# Patient Record
Sex: Male | Born: 1965 | Race: Black or African American | Hispanic: No | Marital: Married | Smoking: Never smoker
Health system: Southern US, Community
[De-identification: ages and names within clinical notes are randomized; demographics above are authoritative.]

## PROBLEM LIST (undated history)

## (undated) DIAGNOSIS — J309 Allergic rhinitis, unspecified: Secondary | ICD-10-CM

## (undated) HISTORY — PX: NO PAST SURGERIES: SHX2092

## (undated) HISTORY — DX: Allergic rhinitis, unspecified: J30.9

---

## 2012-11-28 ENCOUNTER — Emergency Department (HOSPITAL_BASED_OUTPATIENT_CLINIC_OR_DEPARTMENT_OTHER): Payer: 59

## 2012-11-28 ENCOUNTER — Encounter (HOSPITAL_BASED_OUTPATIENT_CLINIC_OR_DEPARTMENT_OTHER): Payer: Self-pay | Admitting: *Deleted

## 2012-11-28 ENCOUNTER — Emergency Department (HOSPITAL_BASED_OUTPATIENT_CLINIC_OR_DEPARTMENT_OTHER)
Admission: EM | Admit: 2012-11-28 | Discharge: 2012-11-28 | Disposition: A | Discharge status: Home / Self Care | Payer: 59 | Attending: Emergency Medicine | Admitting: Emergency Medicine

## 2012-11-28 DIAGNOSIS — IMO0002 Reserved for concepts with insufficient information to code with codable children: Secondary | ICD-10-CM | POA: Insufficient documentation

## 2012-11-28 DIAGNOSIS — Y990 Civilian activity done for income or pay: Secondary | ICD-10-CM | POA: Insufficient documentation

## 2012-11-28 DIAGNOSIS — Y9389 Activity, other specified: Secondary | ICD-10-CM | POA: Insufficient documentation

## 2012-11-28 DIAGNOSIS — X500XXA Overexertion from strenuous movement or load, initial encounter: Secondary | ICD-10-CM | POA: Insufficient documentation

## 2012-11-28 DIAGNOSIS — Y9289 Other specified places as the place of occurrence of the external cause: Secondary | ICD-10-CM | POA: Insufficient documentation

## 2012-11-28 MED ORDER — OXYCODONE-ACETAMINOPHEN 5-325 MG PO TABS: 1.0000 | ORAL_TABLET | ORAL | Status: DC | PRN

## 2012-11-28 MED ORDER — KETOROLAC TROMETHAMINE 60 MG/2ML IM SOLN
60.0000 mg | Freq: Once | INTRAMUSCULAR | Status: AC
  Administered 2012-11-28: 60 mg via INTRAMUSCULAR
  Filled 2012-11-28: qty 2

## 2012-11-28 MED ORDER — DIAZEPAM 5 MG PO TABS
5.0000 mg | ORAL_TABLET | Freq: Once | ORAL | Status: AC
  Administered 2012-11-28: 5 mg via ORAL
  Filled 2012-11-28: qty 1
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
## 2012-11-28 NOTE — ED Notes (Signed)
Lower back pain into his right leg x 4 days. May have hurt himself pulling something at work.

## 2012-11-28 NOTE — ED Provider Notes (Signed)
Medical screening examination/treatment/procedure(s) were performed by non-physician practitioner and as supervising physician I was immediately available for consultation/collaboration.   Gwyneth Sprout, MD 11/28/12 [phone]

## 2013-01-02 ENCOUNTER — Encounter: Payer: Self-pay | Admitting: Internal Medicine

## 2013-01-02 ENCOUNTER — Ambulatory Visit (INDEPENDENT_AMBULATORY_CARE_PROVIDER_SITE_OTHER): Payer: 59 | Admitting: Internal Medicine

## 2013-01-02 VITALS — BP 122/84 | HR 57 | Temp 97.9°F | Ht 76.0 in | Wt 239.0 lb

## 2013-01-02 DIAGNOSIS — R399 Unspecified symptoms and signs involving the genitourinary system: Secondary | ICD-10-CM | POA: Insufficient documentation

## 2013-01-02 DIAGNOSIS — Z0000 Encounter for general adult medical examination without abnormal findings: Secondary | ICD-10-CM | POA: Insufficient documentation

## 2013-01-02 DIAGNOSIS — K649 Unspecified hemorrhoids: Secondary | ICD-10-CM | POA: Insufficient documentation

## 2013-01-02 DIAGNOSIS — R3989 Other symptoms and signs involving the genitourinary system: Secondary | ICD-10-CM

## 2013-01-02 LAB — POCT URINALYSIS DIPSTICK
Bilirubin, UA: NEGATIVE
Nitrite, UA: NEGATIVE
pH, UA: 6.5

## 2013-01-02 NOTE — Assessment & Plan Note (Signed)
Has ano-rectal sx on-off, related to hemorrhoids? Never had a cscope, will ask GI to eval.

## 2013-01-02 NOTE — Progress Notes (Signed)
  Subjective:    Patient ID: Casey Gault., male    DOB: 1966/02/06, 47 y.o.   MRN: [id number]  HPI New patient, requests a CPX, has other concerns, see ROS.  Past Medical History  Diagnosis Date  . Allergic rhinitis    Past Surgical History  Procedure Laterality Date  . No past surgeries     History   Social History  . Marital Status: Married    Spouse Name: N/A    Number of Children: 0  . Years of Education: N/A   Occupational History  . DRIVER, [employer]    Social History Main Topics  . Smoking status: Never Smoker   . Smokeless tobacco: Never Used  . Alcohol Use: No  . Drug Use: No  . Sexually Active: Not on file   Other Topics Concern  . Not on file   Social History Narrative   1 year of college   Moved from [place] 2012.   Lives w/ wife   Works night shift   Family History  Problem Relation Age of Onset  . Hypertension Other     F, M, siblings  . CAD Neg Hx   . Diabetes Neg Hx   . Colon cancer Neg Hx   . Prostate cancer Neg Hx       Review of Systems Experiencing urinary frequency at least 4 times during bed time and 4 times during his work shift. Denies drinking sodas but he drinks plenty of coffee. No difficulty urinating, gross hematuria or dysuria. Long history of on and off rectal discomfort, burning, occasionally sees mucus discharge. Reports no previous bleeding. Never had a colonoscopy or further evaluation. Denies fever chills. No  nausea, vomiting, diarrhea. No chest pain, shortness of breath or cough. No anxiety or depression.     Objective:   Physical Exam BP 122/84  Pulse 57  Temp(Src) 97.9 F (36.6 C) (Oral)  Ht 6\' 4"  (1.93 m)  Wt 239 lb (108.41 kg)  BMI 29.1 kg/m2  SpO2 98%  General -- alert, well-developed, No apparent distress.   Neck --no thyromegaly Lungs -- normal respiratory effort, no intercostal retractions, no accessory muscle use, and normal breath sounds.   Heart-- normal rate, regular rhythm, no murmur,  and no gallop.   Abdomen--soft, non-tender, no distention, no masses, no HSM, no guarding, and no rigidity.   Extremities-- no pretibial edema bilaterally Rectal-- No external abnormalities noted. Normal sphincter tone. No rectal masses or tenderness. Brown stool, Hemoccult negative Prostate:  Prostate gland firm and smooth, no enlargement, nodularity, tenderness, mass, asymmetry or induration. Anoscopy-- one or 2 small internal hemorrhoids without irritation or evidence of recent bleeding. Neurologic-- alert & oriented X3 and strength normal in all extremities. Psych-- Cognition and judgment appear intact. Alert and cooperative with normal attention span and concentration.  not anxious appearing and not depressed appearing.      Assessment & Plan:

## 2013-01-02 NOTE — Assessment & Plan Note (Addendum)
urinary frequency, see HPI. Sx going on x 5 years DRE normal. ?OAB ?BPH Plan: Udip-UCX Avoid caffeine PSA  Consider flomax trial

## 2013-01-02 NOTE — Patient Instructions (Addendum)
Please sign a release of information at the front desk to get  records from your  previous doctor Please come back in 3 months if your urinary symptoms continue. Next visit in one year

## 2013-01-02 NOTE — Assessment & Plan Note (Addendum)
Td-- 2013 ? Pt not sure was done a [place] when he applied for a job, will call HR Never had a cscope, see hemorrhoids Labs Diet-exercise discussed

## 2013-01-03 ENCOUNTER — Encounter: Payer: Self-pay | Admitting: Gastroenterology

## 2013-01-03 LAB — COMPREHENSIVE METABOLIC PANEL
Alkaline Phosphatase: 89 U/L (ref 39–117)
CO2: 27 mEq/L (ref 19–32)
Creatinine, Ser: 0.9 mg/dL (ref 0.4–1.5)
GFR: 114.02 mL/min (ref >60.00)
Glucose, Bld: 79 mg/dL (ref 70–99)
Total Bilirubin: 0.8 mg/dL (ref 0.3–1.2)

## 2013-01-03 LAB — CBC WITH DIFFERENTIAL/PLATELET
Basophils Relative: 0.3 % (ref 0.0–3.0)
Eosinophils Relative: 1.2 % (ref 0.0–5.0)
HCT: 45.1 % (ref 39.0–52.0)
Hemoglobin: 14.9 g/dL (ref 13.0–17.0)
Lymphs Abs: 1.6 10*3/uL (ref 0.7–4.0)
Monocytes Relative: 8.2 % (ref 3.0–12.0)
Neutro Abs: 4.3 10*3/uL (ref 1.4–7.7)
WBC: 6.6 10*3/uL (ref 4.5–10.5)

## 2013-01-03 LAB — LIPID PANEL
LDL Cholesterol: 96 mg/dL (ref 0–99)
Total CHOL/HDL Ratio: 4
VLDL: 16.4 mg/dL (ref 0.0–40.0)

## 2013-01-03 LAB — PSA: PSA: 0.8 ng/mL (ref 0.10–4.00)

## 2013-01-03 LAB — TSH: TSH: 0.79 u[IU]/mL (ref 0.35–5.50)

## 2013-01-06 ENCOUNTER — Ambulatory Visit: Payer: 59 | Admitting: Gastroenterology

## 2013-01-06 ENCOUNTER — Encounter: Payer: Self-pay | Admitting: *Deleted

## 2013-01-30 ENCOUNTER — Ambulatory Visit: Payer: 59 | Admitting: Gastroenterology

## 2013-10-02 ENCOUNTER — Encounter: Payer: Self-pay | Admitting: Emergency Medicine

## 2013-10-02 ENCOUNTER — Ambulatory Visit (INDEPENDENT_AMBULATORY_CARE_PROVIDER_SITE_OTHER): Payer: 59 | Admitting: Sports Medicine

## 2013-10-02 ENCOUNTER — Emergency Department
Admission: EM | Admit: 2013-10-02 | Discharge: 2013-10-02 | Disposition: A | Discharge status: Home / Self Care | Payer: 59 | Source: Home / Self Care

## 2013-10-02 DIAGNOSIS — M6530 Trigger finger, unspecified finger: Secondary | ICD-10-CM

## 2013-10-02 DIAGNOSIS — M65839 Other synovitis and tenosynovitis, unspecified forearm: Secondary | ICD-10-CM

## 2013-10-02 DIAGNOSIS — M65842 Other synovitis and tenosynovitis, left hand: Secondary | ICD-10-CM

## 2013-10-02 DIAGNOSIS — M65849 Other synovitis and tenosynovitis, unspecified hand: Secondary | ICD-10-CM

## 2013-10-02 DIAGNOSIS — IMO0002 Reserved for concepts with insufficient information to code with codable children: Secondary | ICD-10-CM | POA: Insufficient documentation

## 2013-10-02 DIAGNOSIS — M65841 Other synovitis and tenosynovitis, right hand: Secondary | ICD-10-CM

## 2013-10-02 NOTE — ED Provider Notes (Signed)
CSN: [id number]     Arrival date & time 10/02/13  1624 History   None    Chief Complaint  Patient presents with  . Hand Pain      HPI Comments: Patient complains of gradually developing pain in his right third finger about 3 years ago, eventually leading to frequent painful locking of his PIP joint.  Recently he has had persistent swelling and soreness of the PIP joint.  Over the past year or two his left third finger has developed similar symptoms although not as painful.  He recalls no injury to his hands.  Patient is a 48 y.o. male presenting with hand pain. The history is provided by the patient.  Hand Pain This is a chronic problem. Episode onset: 3 years ago. The problem occurs constantly. The problem has been gradually worsening. Associated symptoms comments: none. Exacerbated by: flexing his third fingers. Nothing relieves the symptoms. He has tried nothing for the symptoms.    Past Medical History  Diagnosis Date  . Allergic rhinitis    Past Surgical History  Procedure Laterality Date  . No past surgeries     Family History  Problem Relation Age of Onset  . CAD Neg Hx   . Diabetes Neg Hx   . Colon cancer Neg Hx   . Prostate cancer Neg Hx   . Hypertension Mother   . Hypertension Father   . Stroke Father   . Hypertension Sister   . Hypertension Brother    History  Substance Use Topics  . Smoking status: Never Smoker   . Smokeless tobacco: Never Used  . Alcohol Use: No    Review of Systems  All other systems reviewed and are negative.    Allergies  Sulfa antibiotics  Home Medications  No current outpatient prescriptions on file. BP 116/73  Pulse 70  Temp(Src) 98 F (36.7 C) (Oral)  Resp 16  Ht 6\' 4"  (1.93 m)  Wt 220 lb (99.791 kg)  BMI 26.79 kg/m2  SpO2 97% Physical Exam  Nursing note and vitals reviewed. Constitutional: He is oriented to person, place, and time. He appears well-developed and well-nourished. No distress.  HENT:  Head:  Normocephalic.  Eyes: Conjunctivae are normal. Pupils are equal, round, and reactive to light.  Musculoskeletal:       Right hand: He exhibits swelling. He exhibits normal capillary refill. Normal sensation noted.       Left hand: He exhibits swelling. He exhibits normal capillary refill. Normal sensation noted.       Hands: Third finger of each hand has tenderness and mild swelling over the PIP joint, right worse than left.  After full flexion of the PIP joints, he cannot actively extend his third fingers at the PIP joints.  Neurological: He is alert and oriented to person, place, and time.  Skin: Skin is warm and dry. No erythema.    ED Course  Procedures          MDM   1. Stenosing tenosynovitis of finger of left hand   2. Stenosing tenosynovitis of finger of right hand     Will refer patient to Dr. Rodney Langtonhomas Thekkekandam for bilateral tendon sheath corticosteroid injection and further follow-up.     Lattie HawStephen A Beese, MD 10/05/13 [phone]

## 2013-10-02 NOTE — Progress Notes (Signed)
   Subjective:    I'm seeing this patient as a consultation for:  Dr. Cathren HarshBeese  CC: Bilateral trigger finger  HPI: This is a very pleasant 48 year old male, he comes in with a several week history of pain he localizes in both hands, at the base of the third digit bilaterally with significant pain and triggering of the digits. He has tried analgesics, NSAIDs, topical modalities and nothing has helped his pain which at this point is severe. It does not radiate, persistent.  Past medical history, Surgical history, Family history not pertinant except as noted below, Social history, Allergies, and medications have been entered into the medical record, reviewed, and no changes needed.   Review of Systems: No headache, visual changes, nausea, vomiting, diarrhea, constipation, dizziness, abdominal pain, skin rash, fevers, chills, night sweats, weight loss, swollen lymph nodes, body aches, joint swelling, muscle aches, chest pain, shortness of breath, mood changes, visual or auditory hallucinations.   Objective:   General: Well Developed, well nourished, and in no acute distress.  Neuro/Psych: Alert and oriented x3, extra-ocular muscles intact, able to move all 4 extremities, sensation grossly intact. Skin: Warm and dry, no rashes noted.  Respiratory: Not using accessory muscles, speaking in full sentences, trachea midline.  Cardiovascular: Pulses palpable, no extremity edema. Abdomen: Does not appear distended. Hands: There is a palpable flexor tendon nodule just proximal to the A1 pulley bilaterally. He does have visible and palpable triggering of the finger with palpable and tender flexor tenosynovitis.  Procedure: Real-time Ultrasound Guided Injection of left third flexor tendon sheath Device: GE Logiq E  Verbal informed consent obtained.  Time-out conducted.  Noted no overlying erythema, induration, or other signs of local infection.  Skin prepped in a sterile fashion.  Local anesthesia:  Topical Ethyl chloride.  With sterile technique and under real time ultrasound guidance:  25-gauge needle advanced into the tendon sheath, a total of 0.5 cc Kenalog 40, 1 cc lidocaine injected easily and was seen distending the flexor tendon sheath. Completed without difficulty  Pain immediately resolved suggesting accurate placement of the medication.  Advised to call if fevers/chills, erythema, induration, drainage, or persistent bleeding.  Images permanently stored and available for review in the ultrasound unit.  Impression: Technically successful ultrasound guided injection.  Procedure: Real-time Ultrasound Guided Injection of right third flexor tendon sheath Device: GE Logiq E  Verbal informed consent obtained.  Time-out conducted.  Noted no overlying erythema, induration, or other signs of local infection.  Skin prepped in a sterile fashion.  Local anesthesia: Topical Ethyl chloride.  With sterile technique and under real time ultrasound guidance:  25-gauge needle advanced into the tendon sheath, a total of 0.5 cc Kenalog 40, 1 cc lidocaine injected easily and was seen distending the flexor tendon sheath. There was extensive visible tenosynovitis around the A1 pulley. Completed without difficulty  Pain immediately resolved suggesting accurate placement of the medication.  Advised to call if fevers/chills, erythema, induration, drainage, or persistent bleeding.  Images permanently stored and available for review in the ultrasound unit.  Impression: Technically successful ultrasound guided injection.  Impression and Recommendations:   This case required medical decision making of moderate complexity.

## 2013-10-02 NOTE — ED Notes (Signed)
Gives history of progressively worse pain in #3 fingers of both hands over past 2 years; now very severe and fingers lock with spasms of pain when flexed.

## 2013-10-02 NOTE — Assessment & Plan Note (Signed)
After failure of conservative measures at home, I performed a bilateral third flexor tendon sheath injection as above. He will take it easy, and will come back to see me in about 4 weeks. I can repeat this up to 4-5 times per year if he has a good response.

## 2013-10-06 ENCOUNTER — Ambulatory Visit: Payer: 59 | Admitting: Internal Medicine

## 2013-11-06 ENCOUNTER — Ambulatory Visit (INDEPENDENT_AMBULATORY_CARE_PROVIDER_SITE_OTHER): Payer: 59 | Admitting: Sports Medicine

## 2013-11-06 ENCOUNTER — Encounter: Payer: Self-pay | Admitting: Sports Medicine

## 2013-11-06 VITALS — BP 122/70 | HR 70 | Ht 76.0 in | Wt 221.0 lb

## 2013-11-06 DIAGNOSIS — M6530 Trigger finger, unspecified finger: Secondary | ICD-10-CM

## 2013-11-06 DIAGNOSIS — IMO0002 Reserved for concepts with insufficient information to code with codable children: Secondary | ICD-10-CM

## 2013-11-06 NOTE — Assessment & Plan Note (Signed)
Repeat flexor tendon sheath injection on the right side. Return in 6 weeks.

## 2013-11-06 NOTE — Progress Notes (Signed)
  Subjective:    CC: Followup  HPI: Trigger finger: Bilateral third, excellent response to initial bilateral third flexor tendon sheath injection at the last visit approximately a month ago, approximately 85% better. His left hand is doing extremely well, he does desire repeat interventional treatment on the right side. Symptoms are mild, persistent.  Past medical history, Surgical history, Family history not pertinant except as noted below, Social history, Allergies, and medications have been entered into the medical record, reviewed, and no changes needed.   Review of Systems: No fevers, chills, night sweats, weight loss, chest pain, or shortness of breath.   Objective:    General: Well Developed, well nourished, and in no acute distress.  Neuro: Alert and oriented x3, extra-ocular muscles intact, sensation grossly intact.  HEENT: Normocephalic, atraumatic, pupils equal round reactive to light, neck supple, no masses, no lymphadenopathy, thyroid nonpalpable.  Skin: Warm and dry, no rashes. Cardiac: Regular rate and rhythm, no murmurs rubs or gallops, no lower extremity edema.  Respiratory: Clear to auscultation bilaterally. Not using accessory muscles, speaking in full sentences. Right hand: There is a palpable flexor tendon nodule just proximal to the A1 pulley of the third digit.  Procedure: Real-time Ultrasound Guided Injection of right third flexor tendon sheath Device: GE Logiq E  Verbal informed consent obtained.  Time-out conducted.  Noted no overlying erythema, induration, or other signs of local infection.  Skin prepped in a sterile fashion.  Local anesthesia: Topical Ethyl chloride.  With sterile technique and under real time ultrasound guidance:  1 cc Kenalog 40, 2 cc lidocaine injected easily into the flexor tendon sheath. Completed without difficulty  Pain immediately resolved suggesting accurate placement of the medication.  Advised to call if fevers/chills, erythema,  induration, drainage, or persistent bleeding.  Images permanently stored and available for review in the ultrasound unit.  Impression: Technically successful ultrasound guided injection.  Impression and Recommendations:

## 2013-12-16 ENCOUNTER — Encounter: Payer: Self-pay | Admitting: Emergency Medicine

## 2013-12-16 ENCOUNTER — Emergency Department
Admission: EM | Admit: 2013-12-16 | Discharge: 2013-12-16 | Disposition: A | Discharge status: Home / Self Care | Payer: 59 | Source: Home / Self Care | Attending: Emergency Medicine | Admitting: Emergency Medicine

## 2013-12-16 DIAGNOSIS — J029 Acute pharyngitis, unspecified: Secondary | ICD-10-CM

## 2013-12-16 DIAGNOSIS — M549 Dorsalgia, unspecified: Secondary | ICD-10-CM

## 2013-12-16 LAB — POCT RAPID STREP A (OFFICE): Rapid Strep A Screen: NEGATIVE

## 2013-12-16 MED ORDER — PREDNISONE (PAK) 10 MG PO TABS: ORAL_TABLET | Freq: Every day | ORAL | Status: DC

## 2013-12-16 MED ORDER — AZITHROMYCIN 250 MG PO TABS: ORAL_TABLET | ORAL | Status: DC

## 2013-12-16 NOTE — ED Provider Notes (Signed)
CSN: [id number]     Arrival date & time 12/16/13  4098 History   First MD Initiated Contact with Patient 12/16/13 [phone]     Chief Complaint  Patient presents with  . Sore Throat  . Back Pain   (Consider location/radiation/quality/duration/timing/severity/associated sxs/prior Treatment) HPI Casey Steele is a 48 y.o. male who complains of onset of cold symptoms for 4 days.  The symptoms are constant and mild-moderate in severity.  No flu shot earlier this year.  He has not done any medicines yet.  He doesn't history of seasonal allergic rhinitis. + sore throat (main symptom) + cough No pleuritic pain No wheezing + nasal congestion + post-nasal drainage + sinus pain/pressure No chest congestion No hemoptysis No SOB No chills/sweats No fever No nausea No vomiting No abdominal pain No diarrhea No skin rashes + fatigue + myalgias + headache  + sensitive skin on upper back    Past Medical History  Diagnosis Date  . Allergic rhinitis    Past Surgical History  Procedure Laterality Date  . No past surgeries     Family History  Problem Relation Age of Onset  . CAD Neg Hx   . Diabetes Neg Hx   . Colon cancer Neg Hx   . Prostate cancer Neg Hx   . Hypertension Mother   . Hypertension Father   . Stroke Father   . Hypertension Sister   . Hypertension Brother    History  Substance Use Topics  . Smoking status: Never Smoker   . Smokeless tobacco: Never Used  . Alcohol Use: No    Review of Systems  All other systems reviewed and are negative.    Allergies  Sulfa antibiotics  Home Medications   Current Outpatient Rx  Name  Route  Sig  Dispense  Refill  . azithromycin (ZITHROMAX Z-PAK) 250 MG tablet      Use as directed   1 each   0   . predniSONE (STERAPRED UNI-PAK) 10 MG tablet   Oral   Take by mouth daily. 6 day pack, use as directed   21 tablet   0    BP 122/79  Pulse 67  Temp(Src) 98.7 F (37.1 C) (Oral)  Ht 6\' 4"  (1.93 m)  Wt 221 lb 8 oz (100.472  kg)  BMI 26.97 kg/m2  SpO2 98% Physical Exam  Nursing note and vitals reviewed. Constitutional: He is oriented to person, place, and time. He appears well-developed and well-nourished.  HENT:  Head: Normocephalic and atraumatic.  Right Ear: Tympanic membrane, external ear and ear canal normal.  Left Ear: Tympanic membrane, external ear and ear canal normal.  Nose: Mucosal edema and rhinorrhea present.  Mouth/Throat: Posterior oropharyngeal erythema (Clear postnasal drip) present. No oropharyngeal exudate or posterior oropharyngeal edema.  Eyes: No scleral icterus.  Neck: Neck supple.  Cardiovascular: Regular rhythm and normal heart sounds.   Pulmonary/Chest: Effort normal and breath sounds normal. No respiratory distress. He has no decreased breath sounds. He has no wheezes. He has no rhonchi.  Neurological: He is alert and oriented to person, place, and time.  Skin: Skin is warm and dry.  Upper back generally sensitive to light touch bilaterally, but no rash and no signs of early shingles  Psychiatric: He has a normal mood and affect. His speech is normal.    ED Course  Procedures (including critical care time) Labs Review Labs Reviewed  POCT RAPID STREP A (OFFICE)   Imaging Review No results found.   MDM  1. Acute pharyngitis    Rapid strep test is negative.  No culture was sent. 1)  Take the prescribed antibiotic as instructed.  Most likely is viral however he is still worsening somewhat to place him on some prednisone which I think will help his symptoms the most. 2)  Use nasal saline solution (over the counter) at least 3 times a day. 3)  Use over the counter decongestants like Zyrtec-D every 12 hours as needed to help with congestion.  If you have hypertension, do not take medicines with sudafed.  4)  Can take tylenol every 6 hours or motrin every 8 hours for pain or fever. 5)  Follow up with your primary doctor if no improvement in 5-7 days, sooner if increasing pain,  fever, or new symptoms.     Casey HindJeffrey H Janissa Bertram, MD 12/16/13 1116

## 2013-12-16 NOTE — ED Notes (Signed)
Pt has had sore throat, back shoulder and leg pain 5/10, fatigue, eye pain and headache for 3 days.

## 2014-01-05 ENCOUNTER — Telehealth: Payer: Self-pay

## 2014-01-05 NOTE — Telephone Encounter (Signed)
Medication List and allergies:  Reviewed and updated  90 day supply/mail order: na Local prescriptions: [facility]  Immunizations due: UTD  A/P:   No changes to FH, PSH or Personal Hx Flu vaccine--at work Tdap--2013 [facility]--never had cscope PSA--12/2012--0.80  To Discuss with Provider: Not at this time

## 2014-01-08 ENCOUNTER — Ambulatory Visit (INDEPENDENT_AMBULATORY_CARE_PROVIDER_SITE_OTHER): Payer: 59 | Admitting: Internal Medicine

## 2014-01-08 ENCOUNTER — Encounter: Payer: Self-pay | Admitting: Internal Medicine

## 2014-01-08 VITALS — BP 120/84 | HR 60 | Temp 98.0°F | Ht 76.0 in | Wt 222.0 lb

## 2014-01-08 DIAGNOSIS — M25562 Pain in left knee: Secondary | ICD-10-CM | POA: Insufficient documentation

## 2014-01-08 DIAGNOSIS — Z0000 Encounter for general adult medical examination without abnormal findings: Secondary | ICD-10-CM

## 2014-01-08 LAB — LIPID PANEL
CHOL/HDL RATIO: 3
Cholesterol: 150 mg/dL (ref 0–200)
HDL: 54.3 mg/dL (ref >39.00)
LDL CALC: 88 mg/dL (ref 0–99)
Triglycerides: 39 mg/dL (ref 0.0–149.0)
VLDL: 7.8 mg/dL (ref 0.0–40.0)

## 2014-01-08 LAB — CBC WITH DIFFERENTIAL/PLATELET
BASOS PCT: 0.4 % (ref 0.0–3.0)
Basophils Absolute: 0 10*3/uL (ref 0.0–0.1)
EOS ABS: 0.1 10*3/uL (ref 0.0–0.7)
EOS PCT: 1.9 % (ref 0.0–5.0)
HEMATOCRIT: 45 % (ref 39.0–52.0)
Hemoglobin: 15 g/dL (ref 13.0–17.0)
LYMPHS ABS: 1.4 10*3/uL (ref 0.7–4.0)
Lymphocytes Relative: 28.5 % (ref 12.0–46.0)
MCHC: 33.2 g/dL (ref 30.0–36.0)
MCV: 91.6 fl (ref 78.0–100.0)
MONO ABS: 0.5 10*3/uL (ref 0.1–1.0)
Monocytes Relative: 10.6 % (ref 3.0–12.0)
NEUTROS ABS: 2.8 10*3/uL (ref 1.4–7.7)
NEUTROS PCT: 58.6 % (ref 43.0–77.0)
Platelets: 230 10*3/uL (ref 150.0–400.0)
RBC: 4.92 Mil/uL (ref 4.22–5.81)
RDW: 13.2 % (ref 11.5–14.6)
WBC: 4.8 10*3/uL (ref 4.5–10.5)

## 2014-01-08 LAB — COMPREHENSIVE METABOLIC PANEL
ALBUMIN: 4.2 g/dL (ref 3.5–5.2)
ALK PHOS: 68 U/L (ref 39–117)
ALT: 25 U/L (ref 0–53)
AST: 18 U/L (ref 0–37)
BILIRUBIN TOTAL: 0.5 mg/dL (ref 0.3–1.2)
BUN: 15 mg/dL (ref 6–23)
CO2: 27 mEq/L (ref 19–32)
Calcium: 10.9 mg/dL — ABNORMAL HIGH (ref 8.4–10.5)
Chloride: 107 mEq/L (ref 96–112)
Creatinine, Ser: 0.9 mg/dL (ref 0.4–1.5)
GFR: 119.59 mL/min (ref >60.00)
GLUCOSE: 87 mg/dL (ref 70–99)
POTASSIUM: 4.4 meq/L (ref 3.5–5.1)
SODIUM: 139 meq/L (ref 135–145)
Total Protein: 7 g/dL (ref 6.0–8.3)

## 2014-01-08 NOTE — Progress Notes (Signed)
   Subjective:    Patient ID: Casey GaultEdward Voorhies Jr., male    DOB: 11/26/1965, 48 y.o.   MRN: [id number]  DOS:  01/08/2014 Type of  visit: CPX Doing well, c/o posterior left knee pain for 3 months, on and off, no obvious swelling or redness, symptoms are worse when he gets in and out of his truck; has used Aleve without much help.   ROS Diet-- healthy per pt  Exercise-- active at work, no routine   No  CP, SOB No palpitations, no lower extremity edema Denies  nausea, vomiting diarrhea Denies  blood in the stools No GERD  Sx. (-) cough, sputum production (-) wheezing, chest congestion No dysuria, gross hematuria, difficulty urinating; occ frequency but drinks plenty of fluids  No testicular pain No anxiety, depression   Past Medical History  Diagnosis Date  . Allergic rhinitis     Past Surgical History  Procedure Laterality Date  . No past surgeries      History   Social History  . Marital Status: Married    Spouse Name: N/A    Number of Children: 0  . Years of Education: N/A   Occupational History  . DRIVER, [employer]    Social History Main Topics  . Smoking status: Never Smoker   . Smokeless tobacco: Never Used  . Alcohol Use: No  . Drug Use: No  . Sexual Activity: Not on file   Other Topics Concern  . Not on file   Social History Narrative   1 year of college   Moved from [place] 2012.   Lives w/ wife   Works night shift     Family History  Problem Relation Age of Onset  . CAD Neg Hx   . Diabetes Neg Hx   . Colon cancer Neg Hx   . Prostate cancer Neg Hx   . Hypertension Mother   . Hypertension Father   . Stroke Father     age 50  . Hypertension Sister   . Hypertension Brother        Medication List    Notice As of 01/08/2014  6:20 PM   You have not been prescribed any medications.         Objective:   Physical Exam BP 120/84  Pulse 60  Temp(Src) 98 F (36.7 C)  Ht 6\' 4"  (1.93 m)  Wt 222 lb (100.699 kg)  BMI 27.03 kg/m2  SpO2  99% General -- alert, well-developed, NAD.  Neck --no thyromegaly  HEENT-- Not pale.  Lungs -- normal respiratory effort, no intercostal retractions, no accessory muscle use, and normal breath sounds.  Heart-- normal rate, regular rhythm, no murmur.  Abdomen-- Not distended, good bowel sounds,soft, non-tender.  Extremities-- no pretibial edema bilaterally ; knees normal to inspection, posterior left knee slightly puffy, no definite mass, no pulsatile areas.Both knees have a normal range of motion Neurologic--  alert & oriented X3. Speech normal, gait normal, strength normal in all extremities.  Psych-- Cognition and judgment appear intact. Cooperative with normal attention span and concentration. No anxious or depressed appearing.        Assessment & Plan:

## 2014-01-08 NOTE — Assessment & Plan Note (Signed)
3 months history of posterior left knee pain, some swelling on exam, no pulsatile. Baker's cyst? Plan: Rec to see the sports  medicine doctor,   he is due for a visit with them

## 2014-01-08 NOTE — Assessment & Plan Note (Addendum)
Tdap--2013  [facility]--never had cscope  PSA--12/2012--0.80   labs EKG-- such as bradycardia, ST changes consistent with repolarization, patient is completely asymptomatic. No old EKGs. Cont healthy life style

## 2014-01-08 NOTE — Progress Notes (Signed)
Pre visit review using our clinic review tool, if applicable. No additional management support is needed unless otherwise documented below in the visit note. 

## 2014-01-08 NOTE — Patient Instructions (Signed)
Get your blood work before you leave   Next visit is for a physical exam in 1 year, fasting Please make an appointment    

## 2014-01-09 ENCOUNTER — Encounter: Payer: Self-pay | Admitting: *Deleted

## 2014-01-15 ENCOUNTER — Ambulatory Visit: Payer: 59 | Admitting: Sports Medicine

## 2014-01-22 ENCOUNTER — Ambulatory Visit: Payer: 59 | Admitting: Sports Medicine

## 2014-01-25 ENCOUNTER — Ambulatory Visit (INDEPENDENT_AMBULATORY_CARE_PROVIDER_SITE_OTHER): Payer: 59 | Admitting: Sports Medicine

## 2014-01-25 ENCOUNTER — Encounter: Payer: Self-pay | Admitting: Sports Medicine

## 2014-01-25 ENCOUNTER — Ambulatory Visit (INDEPENDENT_AMBULATORY_CARE_PROVIDER_SITE_OTHER): Payer: 59

## 2014-01-25 VITALS — BP 127/77 | HR 55 | Ht 76.0 in | Wt 224.0 lb

## 2014-01-25 DIAGNOSIS — M25511 Pain in right shoulder: Secondary | ICD-10-CM | POA: Insufficient documentation

## 2014-01-25 DIAGNOSIS — M25569 Pain in unspecified knee: Secondary | ICD-10-CM

## 2014-01-25 DIAGNOSIS — M25562 Pain in left knee: Secondary | ICD-10-CM

## 2014-01-25 DIAGNOSIS — M19049 Primary osteoarthritis, unspecified hand: Secondary | ICD-10-CM

## 2014-01-25 DIAGNOSIS — M25549 Pain in joints of unspecified hand: Secondary | ICD-10-CM

## 2014-01-25 DIAGNOSIS — M25519 Pain in unspecified shoulder: Secondary | ICD-10-CM

## 2014-01-25 DIAGNOSIS — IMO0002 Reserved for concepts with insufficient information to code with codable children: Secondary | ICD-10-CM

## 2014-01-25 NOTE — Assessment & Plan Note (Addendum)
Pain and swelling localized to the right third proximal interphalangeal joint and likely represents degenerative change. Intra-articular injection as above. Return in one month. X-rays. Celebrex samples given.

## 2014-01-25 NOTE — Assessment & Plan Note (Signed)
Resolved after flexor tendon sheath injections.

## 2014-01-25 NOTE — Assessment & Plan Note (Signed)
Likely rotator cuff but we really did discuss this fully. Home rehabilitation exercises given, when he returns to see me in a month he can further discuss his right shoulder.

## 2014-01-25 NOTE — Assessment & Plan Note (Addendum)
X-rays, injection as above. 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 Return to see me in a month. If no improvement we can proceed with Visco supplementation.

## 2014-01-25 NOTE — Progress Notes (Signed)
  Subjective:    CC: Followup  HPI: Flexor tendinitis: Resolved after bilateral third flexor tendon sheath injections.  Right hand pain: Localized to the right third proximal interphalangeal joint, not better with oral NSAIDs. Desires interventional treatment.  Left knee pain: Localized at the medial and lateral joint lines, moderate, persistent, was more swollen previously, no mechanical symptoms catching, locking, buckling.  Past medical history, Surgical history, Family history not pertinant except as noted below, Social history, Allergies, and medications have been entered into the medical record, reviewed, and no changes needed.   Review of Systems: No fevers, chills, night sweats, weight loss, chest pain, or shortness of breath.   Objective:    General: Well Developed, well nourished, and in no acute distress.  Neuro: Alert and oriented x3, extra-ocular muscles intact, sensation grossly intact.  HEENT: Normocephalic, atraumatic, pupils equal round reactive to light, neck supple, no masses, no lymphadenopathy, thyroid nonpalpable.  Skin: Warm and dry, no rashes. Cardiac: Regular rate and rhythm, no murmurs rubs or gallops, no lower extremity edema.  Respiratory: Clear to auscultation bilaterally. Not using accessory muscles, speaking in full sentences. Right hand: Tender to palpation and swollen over the third proximal interphalangeal joint. Left Knee: Normal to inspection with no erythema or effusion or obvious bony abnormalities. Tender to palpation at the joint lines. ROM full in flexion and extension and lower leg rotation. Ligaments with solid consistent endpoints including ACL, PCL, LCL, MCL. Negative Mcmurray's, Apley's, and Thessalonian tests. Non painful patellar compression. Patellar glide without crepitus. Patellar and quadriceps tendons unremarkable. Hamstring and quadriceps strength is normal.   Procedure: Real-time Ultrasound Guided Injection of right third  proximal interphalangeal joint Device: GE Logiq E  Verbal informed consent obtained.  Time-out conducted.  Noted no overlying erythema, induration, or other signs of local infection.  Skin prepped in a sterile fashion.  Local anesthesia: Topical Ethyl chloride.  With sterile technique and under real time ultrasound guidance:  0.5 cc Kenalog 40, 0.5 cc lidocaine injected easily. Completed without difficulty  Pain immediately resolved suggesting accurate placement of the medication.  Advised to call if fevers/chills, erythema, induration, drainage, or persistent bleeding.  Images permanently stored and available for review in the ultrasound unit.  Impression: Technically successful ultrasound guided injection.  Procedure: Real-time Ultrasound Guided Injection of left knee Device: GE Logiq E  Verbal informed consent obtained.  Time-out conducted.  Noted no overlying erythema, induration, or other signs of local infection.  Skin prepped in a sterile fashion.  Local anesthesia: Topical Ethyl chloride.  With sterile technique and under real time ultrasound guidance:  2 cc Kenalog 40, 4 cc lidocaine injected easily. Completed without difficulty  Pain immediately resolved suggesting accurate placement of the medication.  Advised to call if fevers/chills, erythema, induration, drainage, or persistent bleeding.  Images permanently stored and available for review in the ultrasound unit.  Impression: Technically successful ultrasound guided injection.  Impression and Recommendations:

## 2014-02-26 ENCOUNTER — Ambulatory Visit: Payer: 59 | Admitting: Sports Medicine

## 2014-12-20 ENCOUNTER — Telehealth: Payer: Self-pay | Admitting: Internal Medicine

## 2014-12-20 NOTE — Telephone Encounter (Signed)
Pre visit letter sent  °

## 2015-01-09 ENCOUNTER — Telehealth: Payer: Self-pay

## 2015-01-09 NOTE — Telephone Encounter (Signed)
Patient states he's moved back to [place] will not be able to make this appointment

## 2015-01-10 ENCOUNTER — Encounter: Payer: Self-pay | Admitting: Internal Medicine

## 2015-03-01 IMAGING — CR DG SHOULDER 2+V*R*
3 series · 3 of 3 positions shown · non-contrast
Comparison: None.

CLINICAL DATA: Pain.

EXAM:
RIGHT SHOULDER - 2+ VIEW

[view not recorded (1 of 3)]
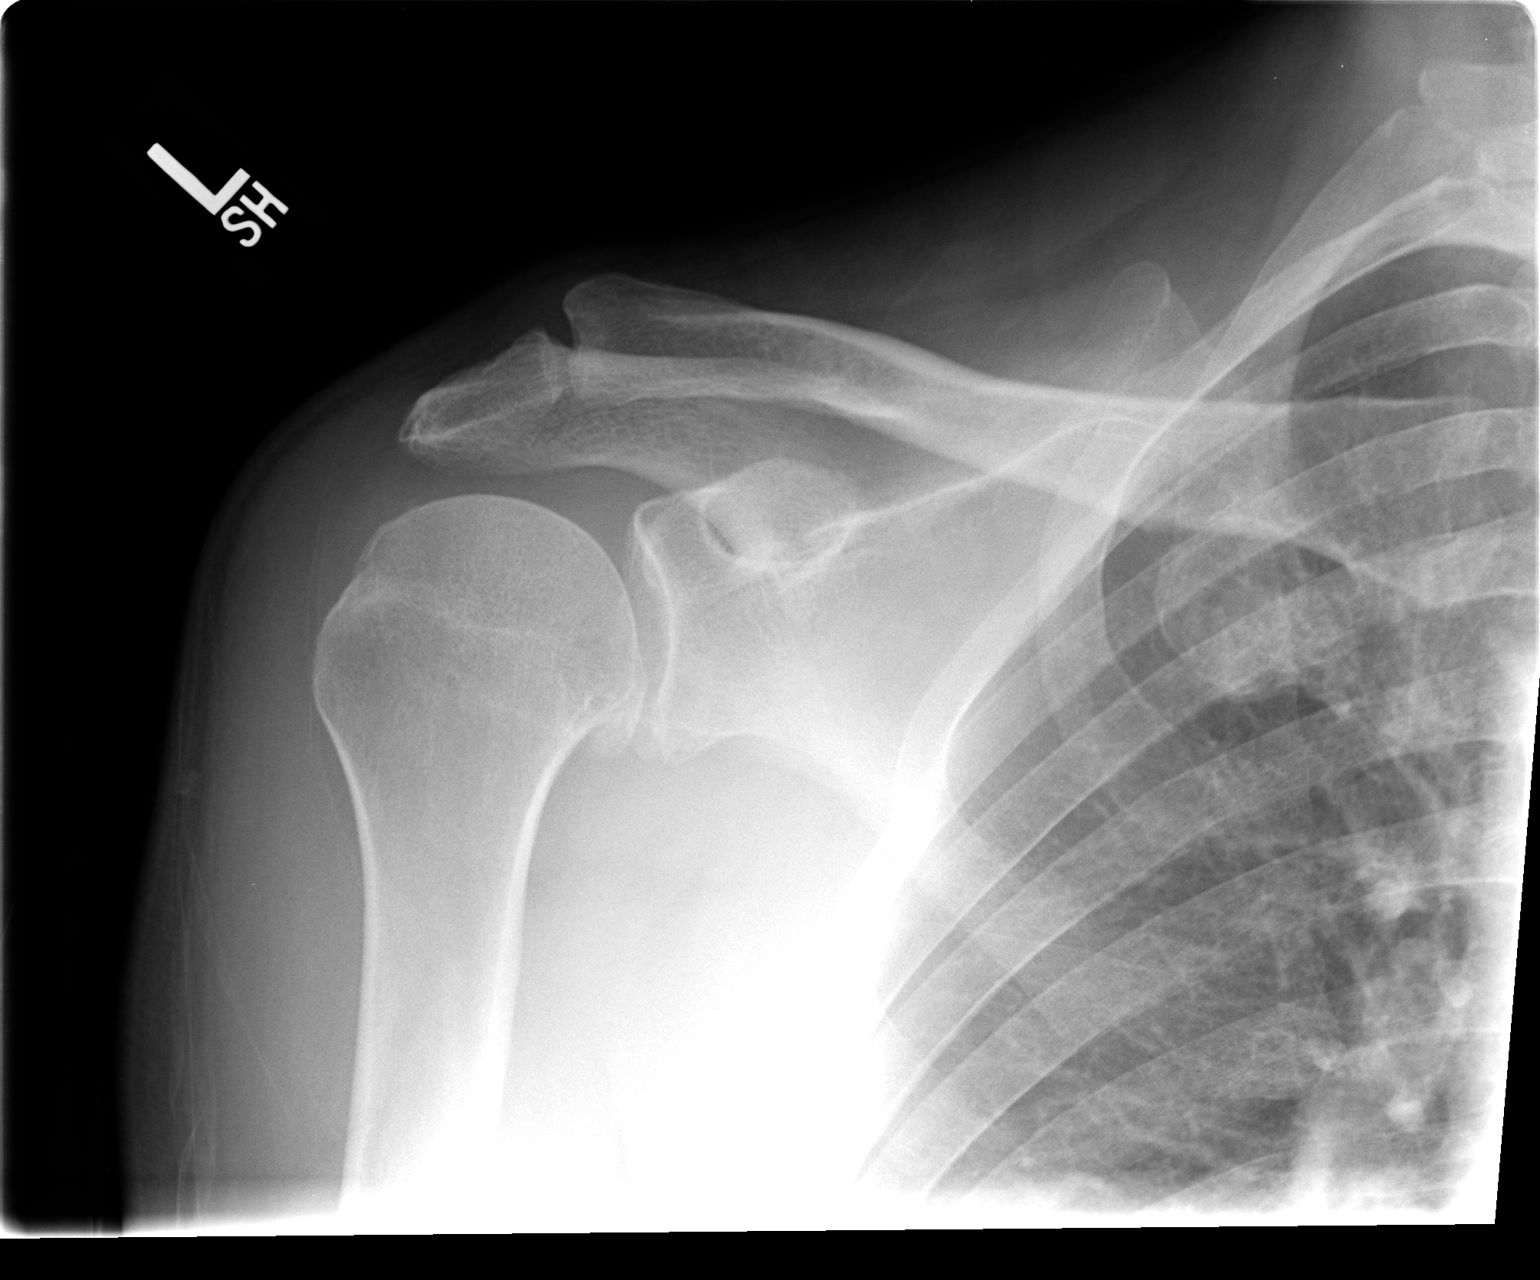

[view not recorded (2 of 3)]
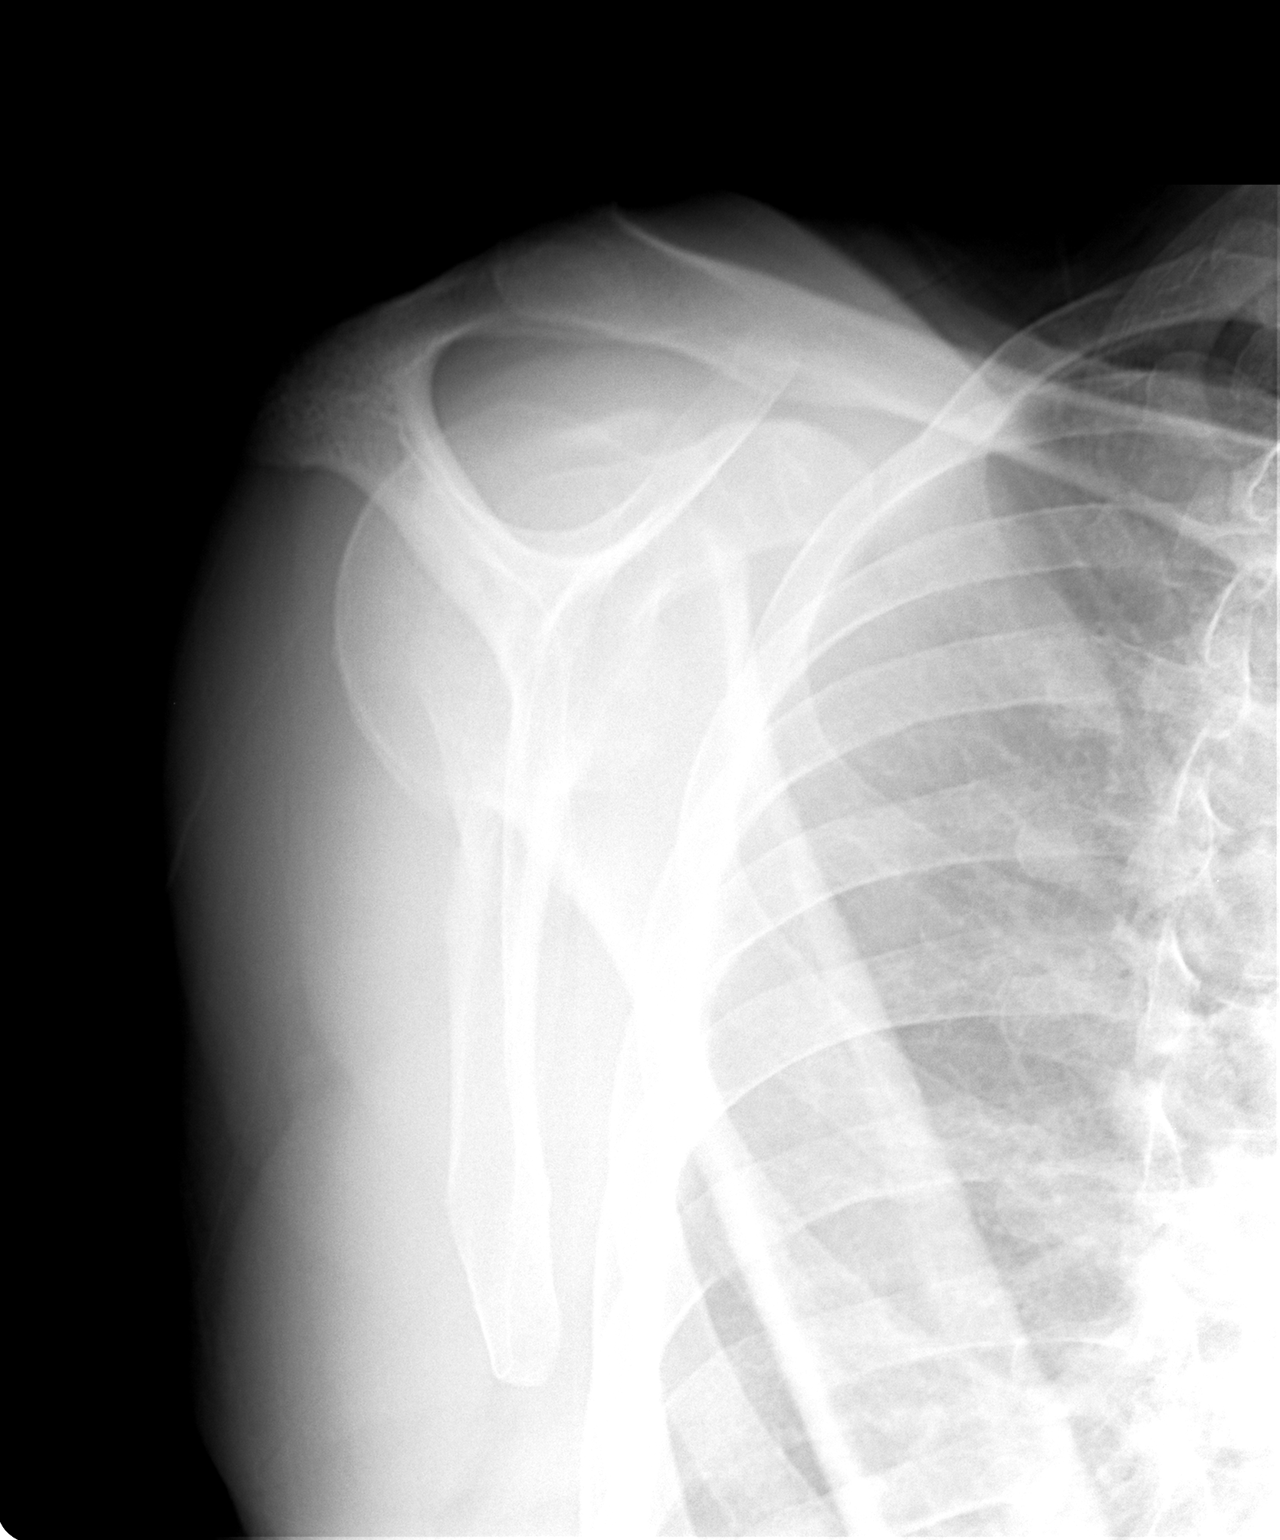

[view not recorded (3 of 3)]
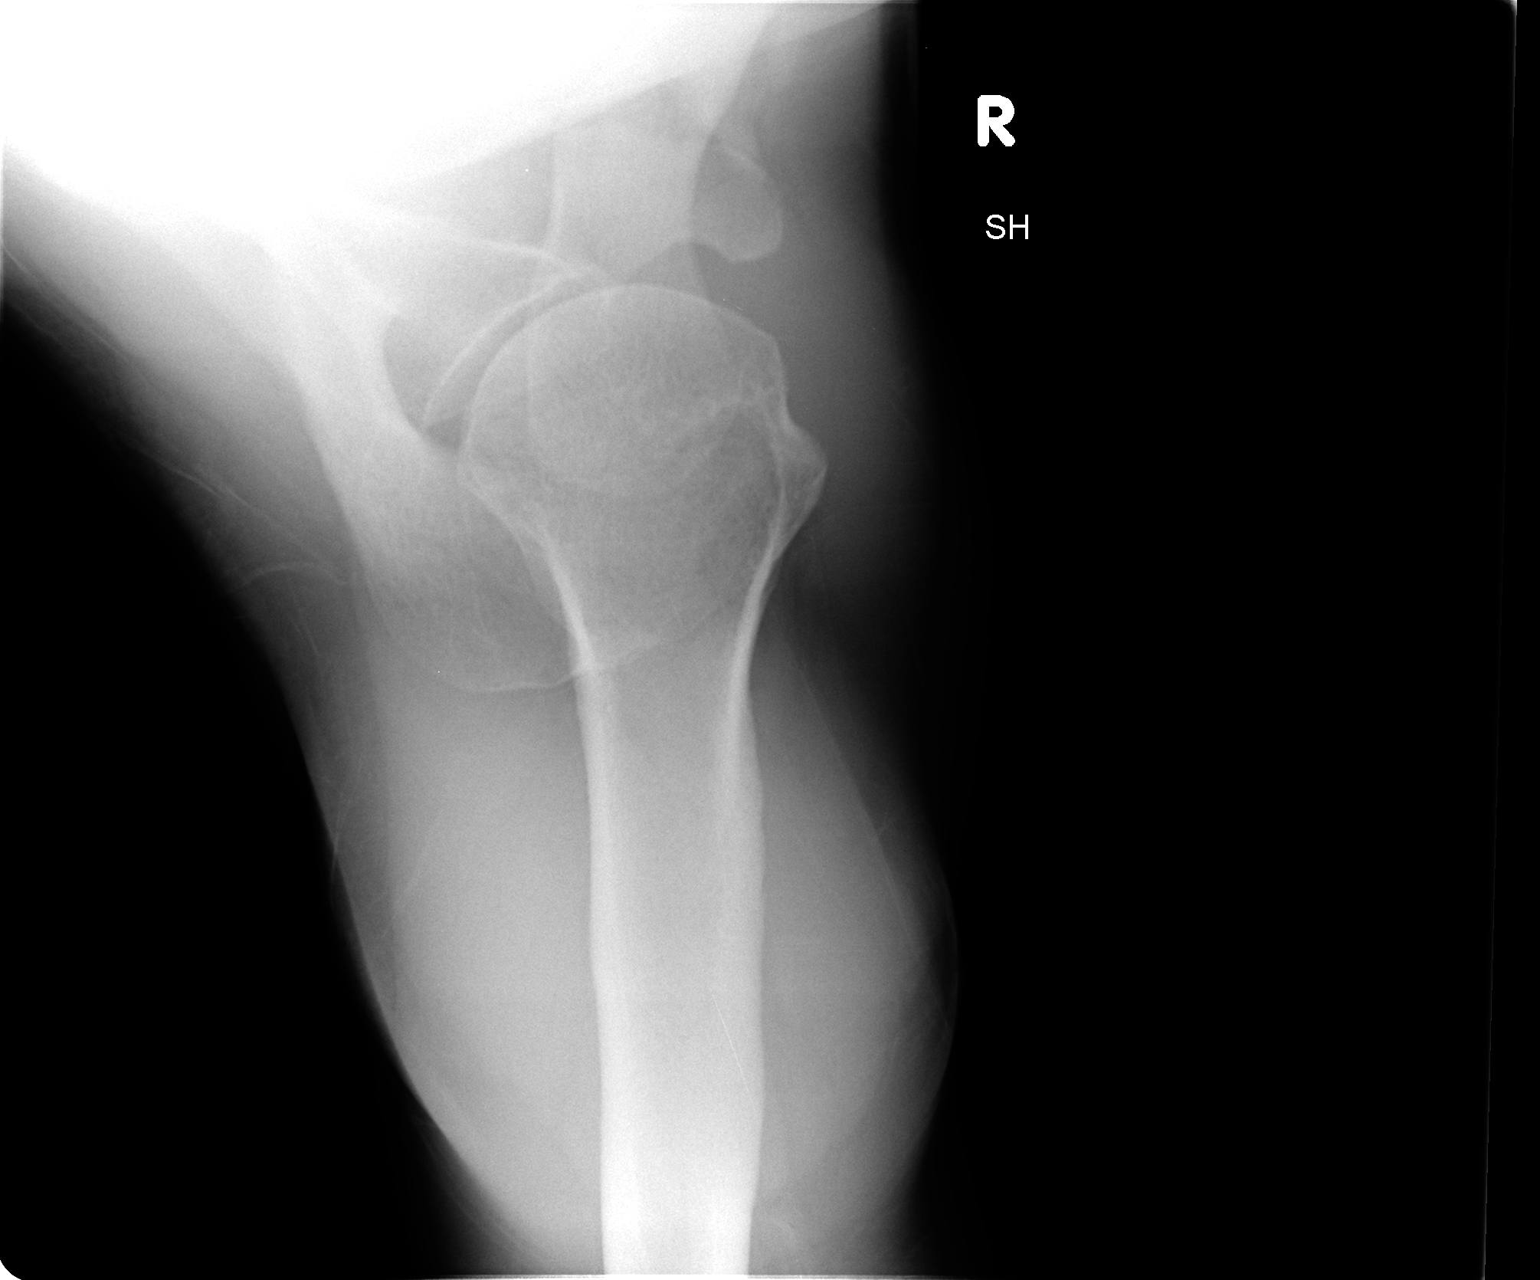

[3 of 3 positions shown; findings below may reference images not displayed]

FINDINGS: There are mild degenerative changes of the AC joint and glenohumeral
joints. There is no fracture dislocation.
IMPRESSION: No acute findings.

Degenerative change of the glenohumeral joint and AC joints.

## 2015-03-01 IMAGING — CR DG HAND COMPLETE 3+V*R*
3 series · 3 of 3 positions shown · non-contrast
Comparison: None.

CLINICAL DATA: Pain of right 3rd PIP joint

EXAM:
RIGHT HAND - COMPLETE 3+ VIEW

[view not recorded (1 of 3)]
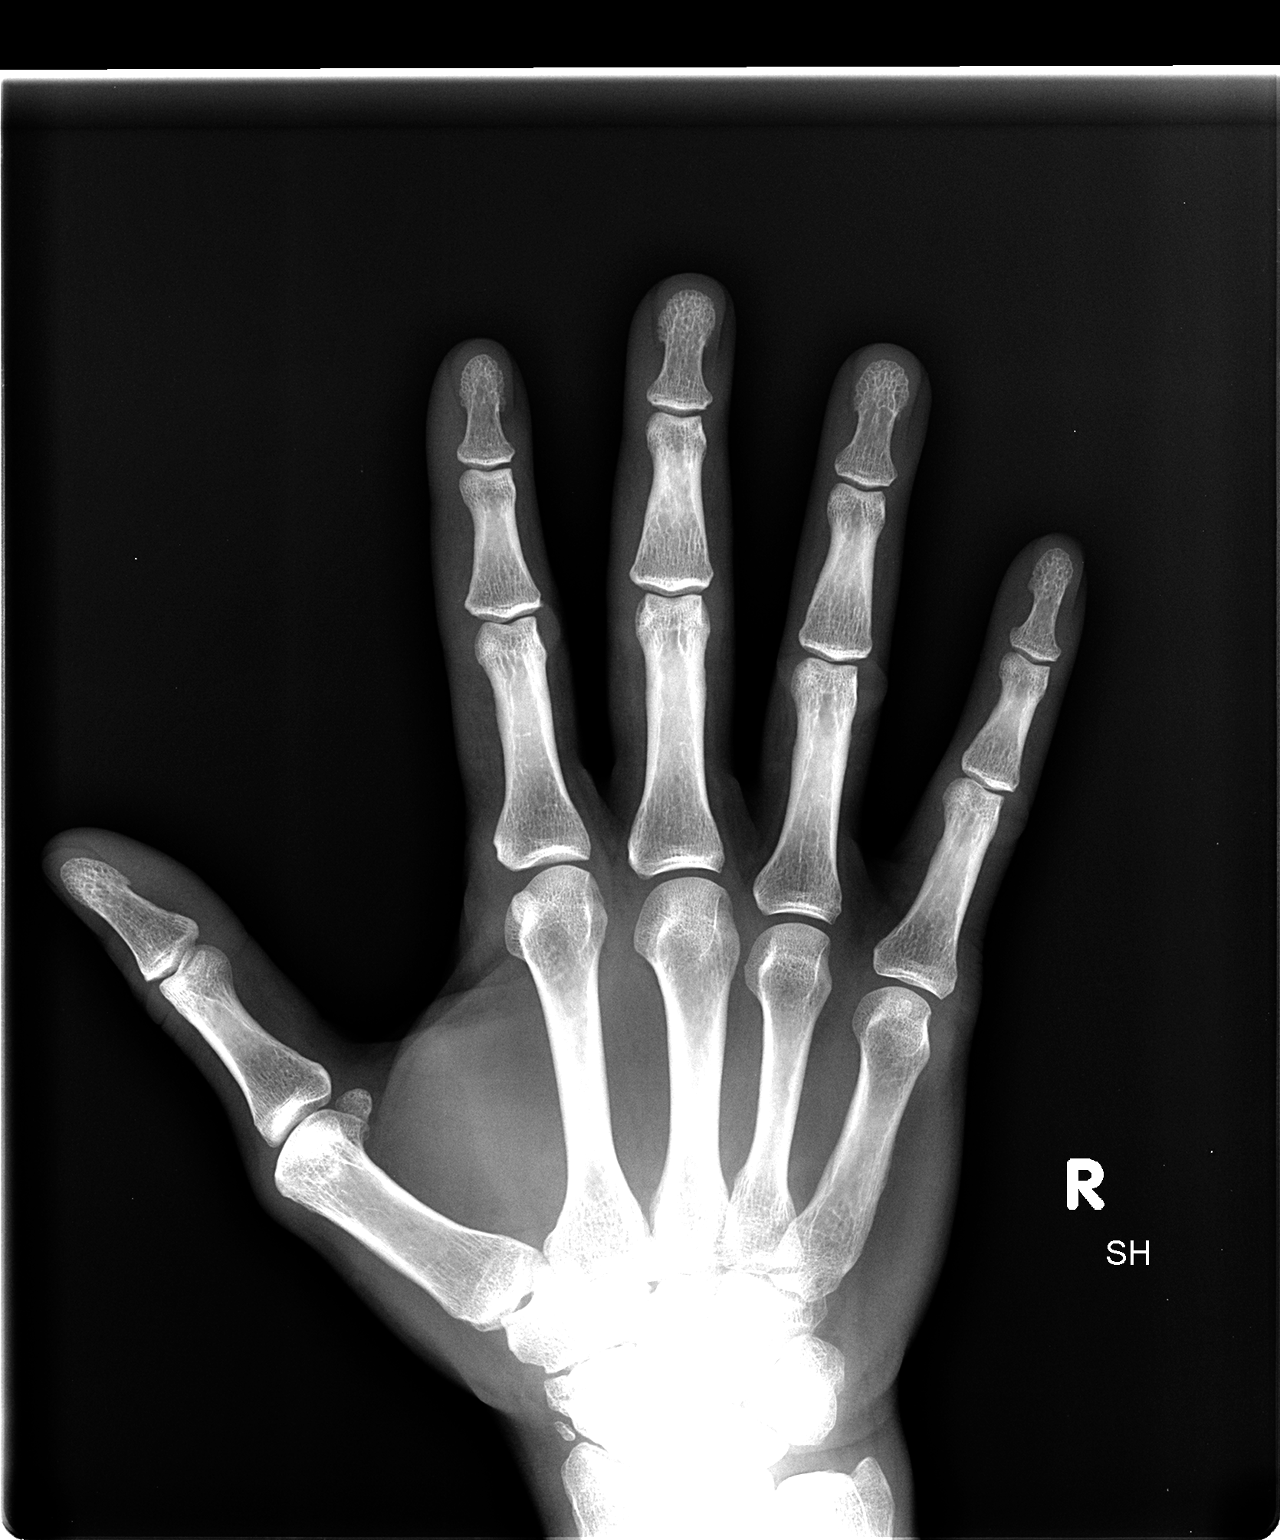

[view not recorded (2 of 3)]
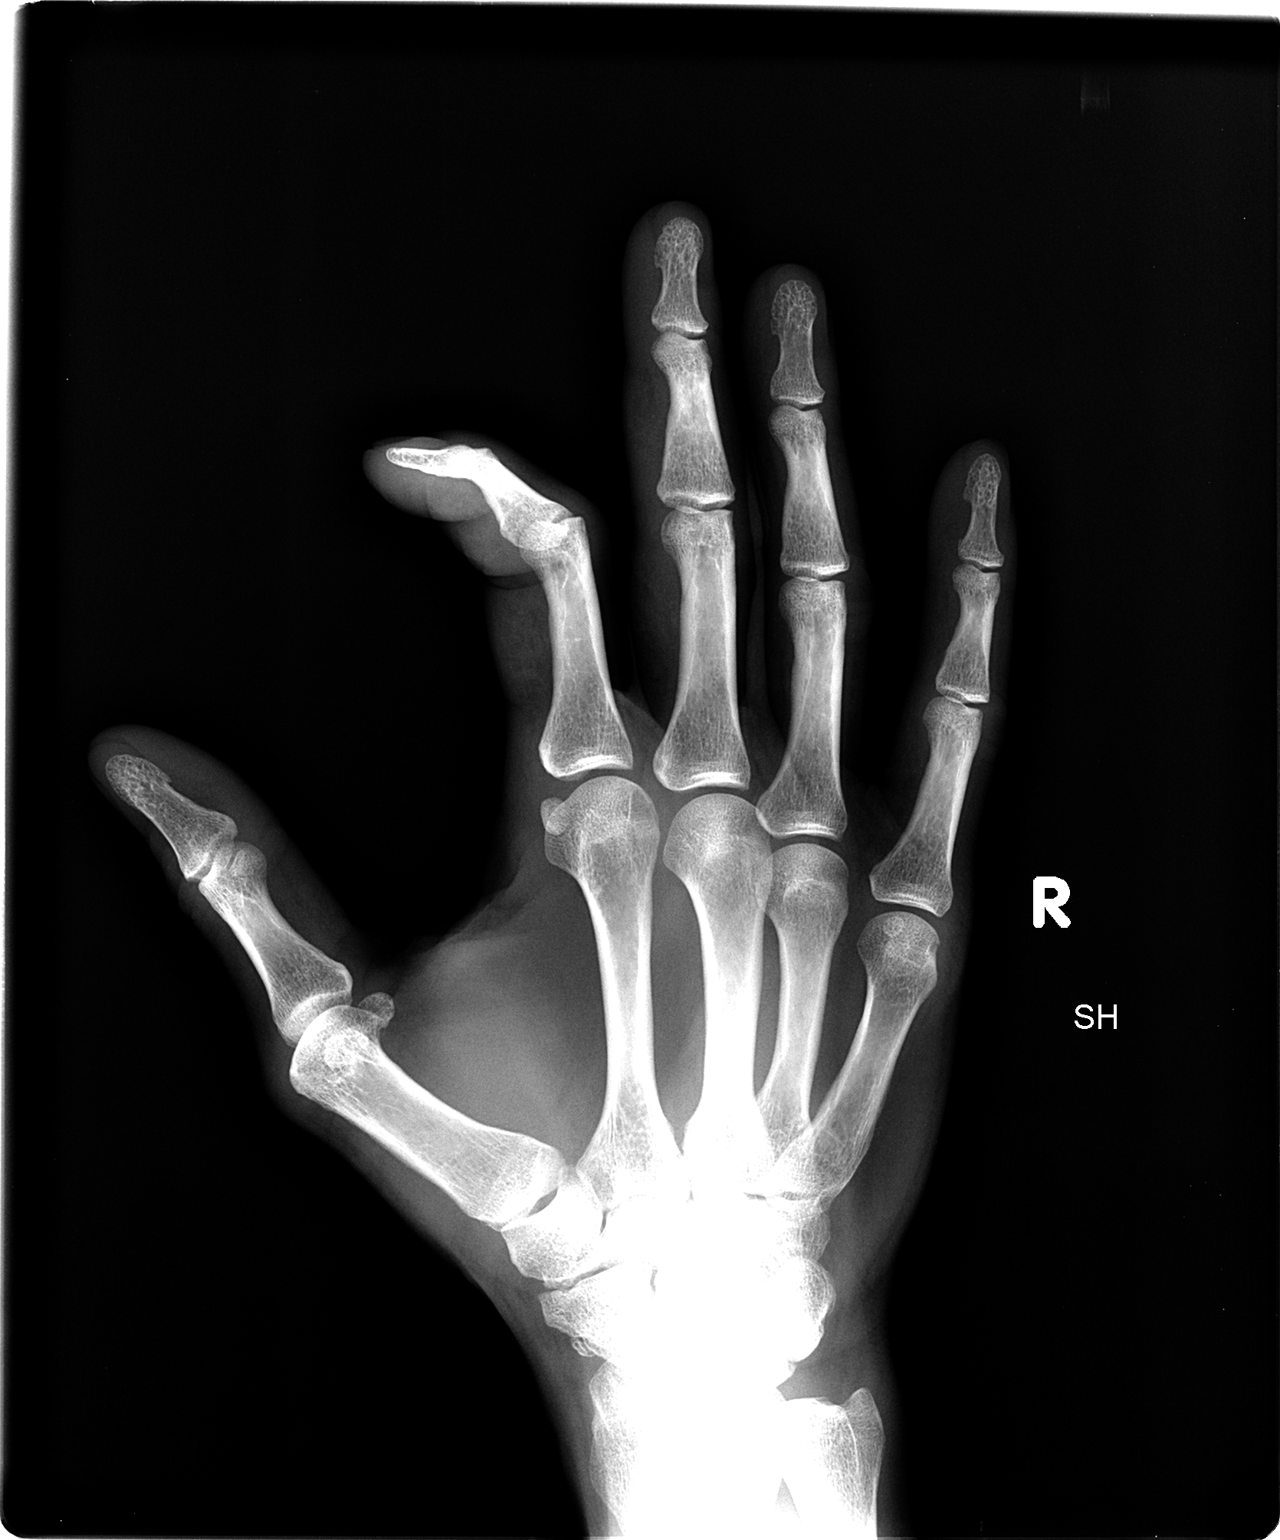

[view not recorded (3 of 3)]
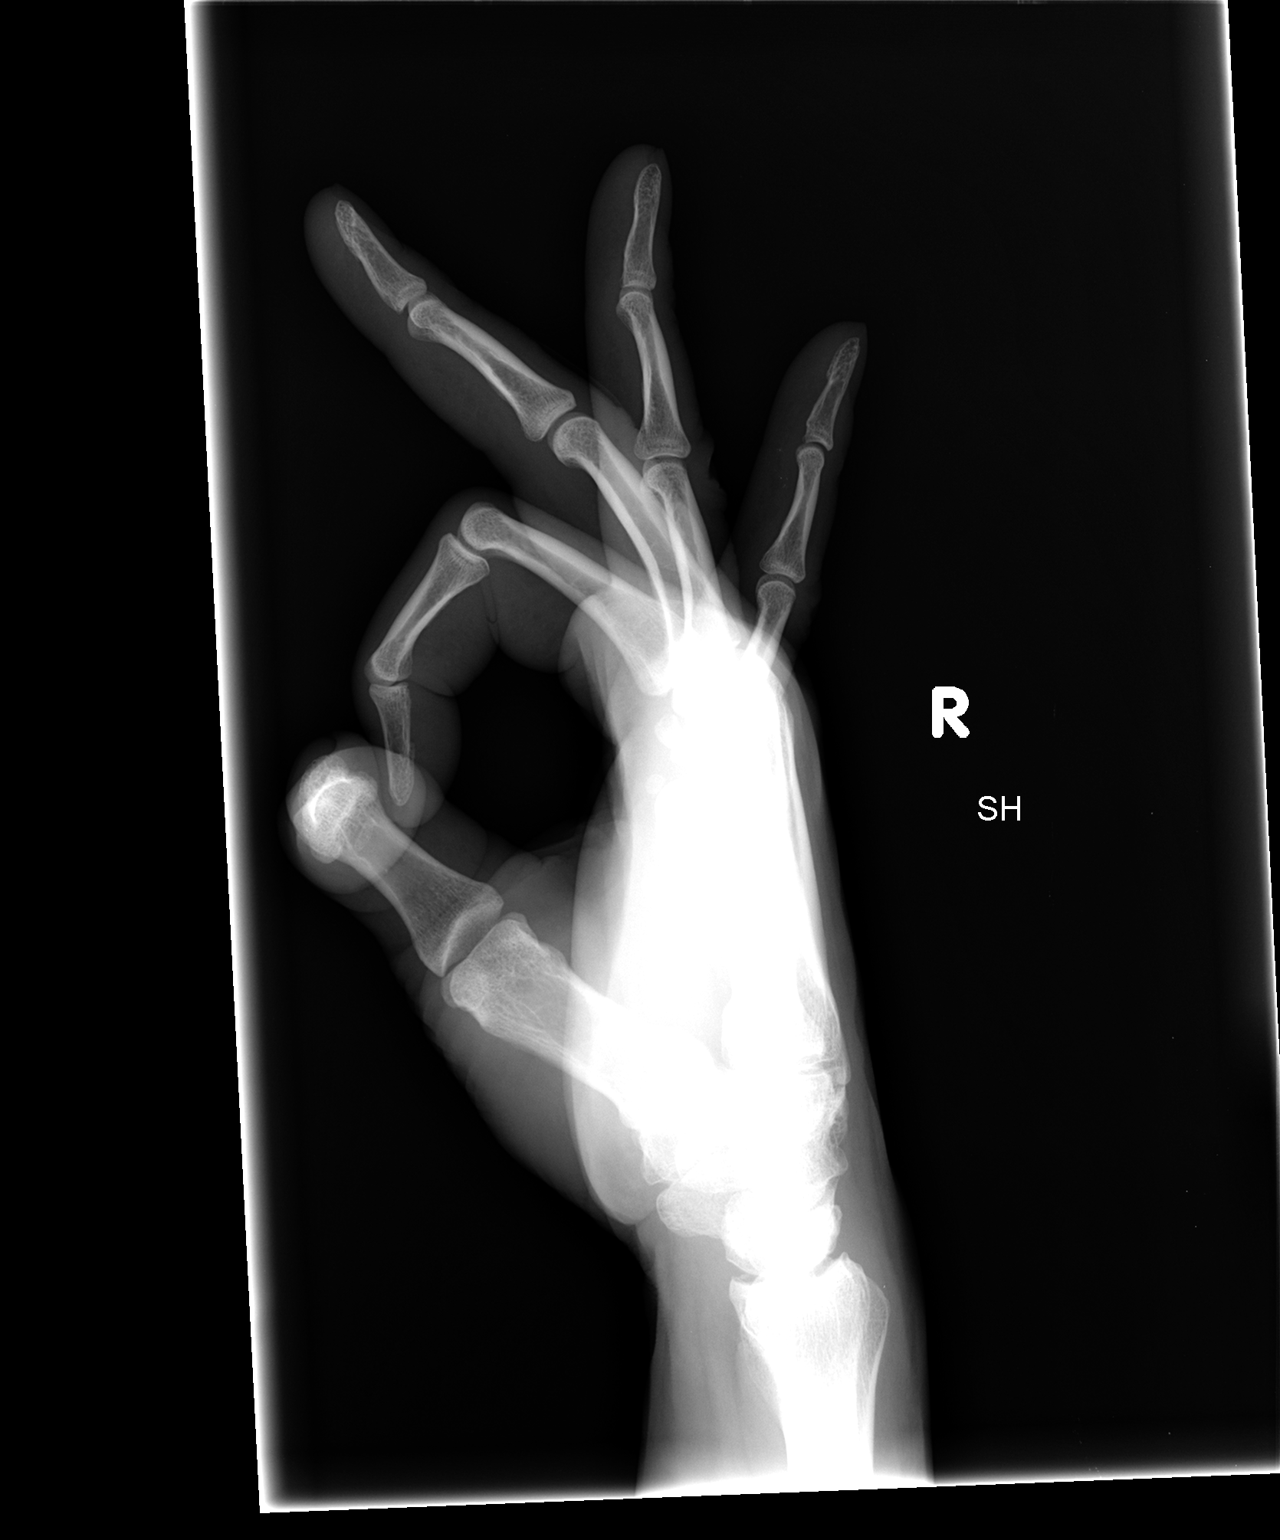

[3 of 3 positions shown; findings below may reference images not displayed]

FINDINGS: No fracture or dislocation is seen.

The joint spaces are preserved.

The visualized soft tissues are unremarkable.
IMPRESSION: No acute osseous abnormality is seen.
